# Patient Record
Sex: Female | Born: 1980 | Race: White | Hispanic: No | Marital: Married | Smoking: Never smoker
Health system: Southern US, Community
[De-identification: ages and names within clinical notes are randomized; demographics above are authoritative.]

## PROBLEM LIST (undated history)

## (undated) ENCOUNTER — Emergency Department (HOSPITAL_COMMUNITY): Admission: EM | Payer: 59 | Source: Home / Self Care

## (undated) DIAGNOSIS — J45909 Unspecified asthma, uncomplicated: Secondary | ICD-10-CM

---

## 2015-08-25 LAB — OB RESULTS CONSOLE HEPATITIS B SURFACE ANTIGEN: HEP B S AG: NEGATIVE

## 2015-08-25 LAB — OB RESULTS CONSOLE ANTIBODY SCREEN: Antibody Screen: NEGATIVE

## 2015-08-25 LAB — OB RESULTS CONSOLE RUBELLA ANTIBODY, IGM: RUBELLA: IMMUNE

## 2015-08-25 LAB — OB RESULTS CONSOLE ABO/RH: RH TYPE: POSITIVE

## 2015-08-25 LAB — OB RESULTS CONSOLE RPR: RPR: NONREACTIVE

## 2015-08-25 LAB — OB RESULTS CONSOLE GC/CHLAMYDIA
Chlamydia: NEGATIVE
Gonorrhea: NEGATIVE

## 2015-08-25 LAB — OB RESULTS CONSOLE HIV ANTIBODY (ROUTINE TESTING): HIV: NONREACTIVE

## 2016-01-25 NOTE — L&D Delivery Note (Signed)
Delivery Note At 8:14 AM a viable female was delivered via  (Presentation:straight OP ;  ).  APGAR: 9, 9; weight  .   Placenta status: , .  Cord:  with the following complications: .  Cord pH: sent  Anesthesia:  epid Episiotomy:  none Lacerations:  Sec deg Suture Repair: 3.0 vicryl rapide Est. Blood Loss (mL):  300  Mom to postpartum.  Baby to Couplet care / Skin to Skin. NICU in attendance   [name] 03/02/2016, 8:36 AM

## 2016-03-02 ENCOUNTER — Inpatient Hospital Stay (HOSPITAL_COMMUNITY): Payer: Managed Care, Other (non HMO) | Admitting: Anesthesiology

## 2016-03-02 ENCOUNTER — Encounter (HOSPITAL_COMMUNITY): Payer: Self-pay | Admitting: *Deleted

## 2016-03-02 ENCOUNTER — Inpatient Hospital Stay (HOSPITAL_COMMUNITY)
Admission: AD | Admit: 2016-03-02 | Discharge: 2016-03-04 | DRG: 775 | Disposition: A | Discharge status: Home / Self Care | Payer: Managed Care, Other (non HMO) | Source: Ambulatory Visit | Attending: Obstetrics and Gynecology | Admitting: Obstetrics and Gynecology

## 2016-03-02 DIAGNOSIS — O4703 False labor before 37 completed weeks of gestation, third trimester: Secondary | ICD-10-CM | POA: Diagnosis not present

## 2016-03-02 DIAGNOSIS — Z3A35 35 weeks gestation of pregnancy: Secondary | ICD-10-CM

## 2016-03-02 HISTORY — DX: Unspecified asthma, uncomplicated: J45.909

## 2016-03-02 LAB — CBC
HEMATOCRIT: 35.3 % — AB (ref 36.0–46.0)
HEMOGLOBIN: 11.6 g/dL — AB (ref 12.0–15.0)
MCH: 28 pg (ref 26.0–34.0)
MCHC: 32.9 g/dL (ref 30.0–36.0)
MCV: 85.3 fL (ref 78.0–100.0)
Platelets: 154 10*3/uL (ref 150–400)
RBC: 4.14 MIL/uL (ref 3.87–5.11)
RDW: 15.2 % (ref 11.5–15.5)
WBC: 9.8 10*3/uL (ref 4.0–10.5)

## 2016-03-02 LAB — TYPE AND SCREEN
ABO/RH(D): A POS
ANTIBODY SCREEN: NEGATIVE

## 2016-03-02 LAB — GROUP B STREP BY PCR: GROUP B STREP BY PCR: NEGATIVE

## 2016-03-02 LAB — RPR: RPR: NONREACTIVE

## 2016-03-02 LAB — ABO/RH: ABO/RH(D): A POS

## 2016-03-02 MED ORDER — BISACODYL 10 MG RE SUPP: 10.0000 mg | Freq: Every day | RECTAL | Status: DC | PRN

## 2016-03-02 MED ORDER — PHENYLEPHRINE 40 MCG/ML (10ML) SYRINGE FOR IV PUSH (FOR BLOOD PRESSURE SUPPORT)
80.0000 ug | PREFILLED_SYRINGE | INTRAVENOUS | Status: DC | PRN
  Filled 2016-03-02: qty 5

## 2016-03-02 MED ORDER — ACETAMINOPHEN 325 MG PO TABS: 650.0000 mg | ORAL_TABLET | ORAL | Status: DC | PRN

## 2016-03-02 MED ORDER — TETANUS-DIPHTH-ACELL PERTUSSIS 5-2.5-18.5 LF-MCG/0.5 IM SUSP
0.5000 mL | Freq: Once | INTRAMUSCULAR | Status: DC
Start: 2016-03-03 — End: 2016-03-04

## 2016-03-02 MED ORDER — OXYTOCIN 40 UNITS IN LACTATED RINGERS INFUSION - SIMPLE MED
2.5000 [IU]/h | INTRAVENOUS | Status: DC
  Filled 2016-03-02: qty 1000

## 2016-03-02 MED ORDER — OXYTOCIN BOLUS FROM INFUSION: 500.0000 mL | Freq: Once | INTRAVENOUS | Status: DC

## 2016-03-02 MED ORDER — EPHEDRINE 5 MG/ML INJ
10.0000 mg | INTRAVENOUS | Status: DC | PRN
  Filled 2016-03-02: qty 4

## 2016-03-02 MED ORDER — ONDANSETRON HCL 4 MG/2ML IJ SOLN: 4.0000 mg | INTRAMUSCULAR | Status: DC | PRN

## 2016-03-02 MED ORDER — BETAMETHASONE SOD PHOS & ACET 6 (3-3) MG/ML IJ SUSP
12.0000 mg | Freq: Once | INTRAMUSCULAR | Status: AC
  Administered 2016-03-02: 12 mg via INTRAMUSCULAR
  Filled 2016-03-02: qty 2

## 2016-03-02 MED ORDER — FENTANYL 2.5 MCG/ML BUPIVACAINE 1/10 % EPIDURAL INFUSION (WH - ANES): 14.0000 mL/h | INTRAMUSCULAR | Status: DC | PRN

## 2016-03-02 MED ORDER — SENNOSIDES-DOCUSATE SODIUM 8.6-50 MG PO TABS
2.0000 | ORAL_TABLET | ORAL | Status: DC
  Administered 2016-03-02 – 2016-03-03 (×2): 2 via ORAL
  Filled 2016-03-02 (×2): qty 2

## 2016-03-02 MED ORDER — SODIUM CHLORIDE 0.9 % IV SOLN
2.0000 g | Freq: Once | INTRAVENOUS | Status: AC
  Administered 2016-03-02: 2 g via INTRAVENOUS
  Filled 2016-03-02: qty 2000

## 2016-03-02 MED ORDER — FLEET ENEMA 7-19 GM/118ML RE ENEM: 1.0000 | ENEMA | Freq: Every day | RECTAL | Status: DC | PRN

## 2016-03-02 MED ORDER — MEASLES, MUMPS & RUBELLA VAC ~~LOC~~ INJ
0.5000 mL | INJECTION | Freq: Once | SUBCUTANEOUS | Status: DC
  Filled 2016-03-02: qty 0.5

## 2016-03-02 MED ORDER — WITCH HAZEL-GLYCERIN EX PADS
1.0000 "application " | MEDICATED_PAD | CUTANEOUS | Status: DC | PRN
  Administered 2016-03-02: 1 via TOPICAL

## 2016-03-02 MED ORDER — FLEET ENEMA 7-19 GM/118ML RE ENEM: 1.0000 | ENEMA | RECTAL | Status: DC | PRN

## 2016-03-02 MED ORDER — OXYCODONE-ACETAMINOPHEN 5-325 MG PO TABS: 2.0000 | ORAL_TABLET | ORAL | Status: DC | PRN

## 2016-03-02 MED ORDER — DIBUCAINE 1 % RE OINT: 1.0000 "application " | TOPICAL_OINTMENT | RECTAL | Status: DC | PRN

## 2016-03-02 MED ORDER — SIMETHICONE 80 MG PO CHEW: 80.0000 mg | CHEWABLE_TABLET | ORAL | Status: DC | PRN

## 2016-03-02 MED ORDER — LACTATED RINGERS IV SOLN: INTRAVENOUS | Status: DC

## 2016-03-02 MED ORDER — OXYCODONE-ACETAMINOPHEN 5-325 MG PO TABS
2.0000 | ORAL_TABLET | ORAL | Status: DC | PRN
Start: 2016-03-02 — End: 2016-03-04

## 2016-03-02 MED ORDER — OXYCODONE-ACETAMINOPHEN 5-325 MG PO TABS: 1.0000 | ORAL_TABLET | ORAL | Status: DC | PRN

## 2016-03-02 MED ORDER — BENZOCAINE-MENTHOL 20-0.5 % EX AERO
1.0000 "application " | INHALATION_SPRAY | CUTANEOUS | Status: DC | PRN
  Filled 2016-03-02: qty 56

## 2016-03-02 MED ORDER — ONDANSETRON HCL 4 MG PO TABS
4.0000 mg | ORAL_TABLET | ORAL | Status: DC | PRN
Start: 2016-03-02 — End: 2016-03-04

## 2016-03-02 MED ORDER — ONDANSETRON HCL 4 MG/2ML IJ SOLN: 4.0000 mg | Freq: Four times a day (QID) | INTRAMUSCULAR | Status: DC | PRN

## 2016-03-02 MED ORDER — LIDOCAINE HCL (PF) 1 % IJ SOLN
INTRAMUSCULAR | Status: DC | PRN
  Administered 2016-03-02 (×2): 4 mL via EPIDURAL

## 2016-03-02 MED ORDER — DIPHENHYDRAMINE HCL 50 MG/ML IJ SOLN: 12.5000 mg | INTRAMUSCULAR | Status: DC | PRN

## 2016-03-02 MED ORDER — LACTATED RINGERS IV SOLN: 500.0000 mL | INTRAVENOUS | Status: DC | PRN

## 2016-03-02 MED ORDER — COCONUT OIL OIL: 1.0000 "application " | TOPICAL_OIL | Status: DC | PRN

## 2016-03-02 MED ORDER — IBUPROFEN 800 MG PO TABS: 800.0000 mg | ORAL_TABLET | Freq: Three times a day (TID) | ORAL | Status: DC | PRN

## 2016-03-02 MED ORDER — PRENATAL MULTIVITAMIN CH
1.0000 | ORAL_TABLET | Freq: Every day | ORAL | Status: DC
  Administered 2016-03-02 – 2016-03-03 (×2): 1 via ORAL
  Filled 2016-03-02 (×2): qty 1

## 2016-03-02 MED ORDER — LIDOCAINE HCL (PF) 1 % IJ SOLN
30.0000 mL | INTRAMUSCULAR | Status: DC | PRN
  Filled 2016-03-02: qty 30

## 2016-03-02 MED ORDER — OXYCODONE-ACETAMINOPHEN 5-325 MG PO TABS
1.0000 | ORAL_TABLET | ORAL | Status: DC | PRN
Start: 2016-03-02 — End: 2016-03-04

## 2016-03-02 MED ORDER — FENTANYL 2.5 MCG/ML BUPIVACAINE 1/10 % EPIDURAL INFUSION (WH - ANES)
14.0000 mL/h | INTRAMUSCULAR | Status: DC | PRN
  Administered 2016-03-02: 14 mL/h via EPIDURAL

## 2016-03-02 MED ORDER — DIPHENHYDRAMINE HCL 25 MG PO CAPS: 25.0000 mg | ORAL_CAPSULE | Freq: Four times a day (QID) | ORAL | Status: DC | PRN

## 2016-03-02 MED ORDER — OXYTOCIN 40 UNITS IN LACTATED RINGERS INFUSION - SIMPLE MED
1.0000 m[IU]/min | INTRAVENOUS | Status: DC
  Administered 2016-03-02: 1 m[IU]/min via INTRAVENOUS

## 2016-03-02 MED ORDER — TERBUTALINE SULFATE 1 MG/ML IJ SOLN
0.2500 mg | Freq: Once | INTRAMUSCULAR | Status: DC | PRN
  Filled 2016-03-02: qty 1

## 2016-03-02 MED ORDER — SOD CITRATE-CITRIC ACID 500-334 MG/5ML PO SOLN: 30.0000 mL | ORAL | Status: DC | PRN

## 2016-03-02 MED ORDER — FENTANYL 2.5 MCG/ML BUPIVACAINE 1/10 % EPIDURAL INFUSION (WH - ANES)
INTRAMUSCULAR | Status: AC
  Filled 2016-03-02: qty 100

## 2016-03-02 MED ORDER — LACTATED RINGERS IV SOLN: 500.0000 mL | Freq: Once | INTRAVENOUS | Status: DC

## 2016-03-02 MED ORDER — ZOLPIDEM TARTRATE 5 MG PO TABS: 5.0000 mg | ORAL_TABLET | Freq: Every evening | ORAL | Status: DC | PRN

## 2016-03-02 MED ORDER — ACETAMINOPHEN 325 MG PO TABS
650.0000 mg | ORAL_TABLET | ORAL | Status: DC | PRN
  Administered 2016-03-02 – 2016-03-03 (×5): 650 mg via ORAL
  Filled 2016-03-02 (×5): qty 2

## 2016-03-02 NOTE — Anesthesia Preprocedure Evaluation (Signed)
Anesthesia Evaluation  Patient identified by MRN, date of birth, ID band Patient awake    Reviewed: Allergy & Precautions, Patient's Chart, lab work & pertinent test results  Airway Mallampati: III       Dental no notable dental hx. (+) Teeth Intact   Pulmonary asthma ,    Pulmonary exam normal breath sounds clear to auscultation       Cardiovascular negative cardio ROS Normal cardiovascular exam Rhythm:Regular Rate:Normal     Neuro/Psych negative neurological ROS  negative psych ROS   GI/Hepatic negative GI ROS, Neg liver ROS,   Endo/Other  negative endocrine ROS  Renal/GU negative Renal ROS  negative genitourinary   Musculoskeletal negative musculoskeletal ROS (+)   Abdominal   Peds  Hematology negative hematology ROS (+)   Anesthesia Other Findings   Reproductive/Obstetrics (+) Pregnancy                             Anesthesia Physical Anesthesia Plan  ASA: II  Anesthesia Plan: Epidural   Post-op Pain Management:    Induction:   Airway Management Planned: Natural Airway  Additional Equipment:   Intra-op Plan:   Post-operative Plan:   Informed Consent: I have reviewed the patients History and Physical, chart, labs and discussed the procedure including the risks, benefits and alternatives for the proposed anesthesia with the patient or authorized representative who has indicated his/her understanding and acceptance.     Plan Discussed with: Anesthesiologist  Anesthesia Plan Comments:         Anesthesia Quick Evaluation

## 2016-03-02 NOTE — Lactation Note (Signed)
This note was copied from a baby's chart. Lactation Consultation Note  Patient Name: Linda [name]: 03/02/2016 Reason for consult: Follow-up assessment  Baby is 7 hours old, 2nd LC visit today. Per mom the baby has been breast feeding well .  @ the time of this consult baby , per mom baby due feed. LC checked diaper, and changed wet Diaper.  Mom independent with latch. LC offered pillow support and mom declined. Mom leaning towards the baby. LC asked mom if she needed pillow support for her back and mom declined.  Mom obtained depth on her own,baby's lips flanged, multiple swallows noted and baby fed 17 mins. Per mom comfortable for the whole feeding , and baby content after feeding.  LC set up the DEBP due to the baby  being a LPT, and mom was not ready to pump.  Per mom familiar with early baby's and pumping due her last baby being in NICU for a week.  LC also encouraged mom to hand express, save the milk so the baby can be spoon fed.  Mother informed of post-discharge support and given phone number to the lactation department, including services for phone call assistance; out-patient appointments; and breastfeeding support group. List of other breastfeeding resources in the community given in the handout. Encouraged mother to call for problems or concerns related to breastfeeding.   Maternal Data Has patient been taught Hand Expression?: Yes (per mom feels comfortable )  Feeding Feeding Type: Breast Fed Length of feed: 20 min  LATCH Score/Interventions Latch: Grasps breast easily, tongue down, lips flanged, rhythmical sucking.  Audible Swallowing: Spontaneous and intermittent  Type of Nipple: Everted at rest and after stimulation  Comfort (Breast/Nipple): Soft / non-tender     Hold (Positioning): No assistance needed to correctly position infant at breast.  LATCH Score: 10  Lactation Tools Discussed/Used     Consult Status Consult Status: Follow-up [name]:  03/02/16 Follow-up type: In-patient    [name] 03/02/2016, 2:54 PM

## 2016-03-02 NOTE — Anesthesia Postprocedure Evaluation (Signed)
Anesthesia Post Note  Patient: Linda KaufmannNaudia [name]  Procedure(s) Performed: * No procedures listed *  Patient location during evaluation: Mother Baby Anesthesia Type: Epidural Level of consciousness: awake, awake and alert and oriented Pain management: pain level controlled Vital Signs Assessment: post-procedure vital signs reviewed and stable Respiratory status: spontaneous breathing and nonlabored ventilation Cardiovascular status: stable Postop Assessment: no headache, no backache, epidural receding, patient able to bend at knees, no signs of nausea or vomiting and adequate PO intake Anesthetic complications: no        Last Vitals:  Vitals:   03/02/16 1048 03/02/16 1139  BP: (!) 98/56 (!) 99/56  Pulse: 76 71  Resp: 18 16  Temp: 36.8 C 36.6 C    Last Pain:  Vitals:   03/02/16 1139  TempSrc: Oral  PainSc:    Pain Goal:                 [name],[name]

## 2016-03-02 NOTE — MAU Provider Note (Signed)
Chief Complaint:  Contractions   First Provider Initiated Contact with Patient 03/02/2016 at 0425.   HPI: Linda [name] is a 36 y.o. [id number] at 5228w6d who presents to maternity admissions reporting contractions every 5 minutes since 0200. Denies LOF or VB. Hx PTD x 3. Has not received BMZ this pregnancy.   Good fetal movement.   Pregnancy Course: 17-P this pregnancy  Past Medical History:  Diagnosis Date  . Asthma    OB History  Gravida Para Term Preterm AB Living  5 4 1 3   4   SAB TAB Ectopic Multiple Live Births          4    # Outcome Date GA Lbr Len/2nd Weight Sex Delivery Anes PTL Lv  5 Current           4 Preterm      Vag-Spont     3 Term      Vag-Spont     2 Preterm      Vag-Spont     1 Preterm      Vag-Spont        History reviewed. No pertinent surgical history. History reviewed. No pertinent family history. Social History  Substance Use Topics  . Smoking status: Never Smoker  . Smokeless tobacco: Never Used  . Alcohol use No   No Known Allergies Prescriptions Prior to Admission  Medication Sig Dispense Refill Last Dose  . acetaminophen (TYLENOL) 325 MG tablet Take 650 mg by mouth every 6 (six) hours as needed.   03/01/2016 at Unknown time  . Prenatal Vit-Fe Fumarate-FA (PRENATAL MULTIVITAMIN) TABS tablet Take 1 tablet by mouth daily at 12 noon.   03/01/2016 at Unknown time    I have reviewed patient's Past Medical Hx, Surgical Hx, Family Hx, Social Hx, medications and allergies.   ROS:  Review of Systems  Constitutional: Negative for chills and fever.  Gastrointestinal: Positive for abdominal pain (contractions only ).  Genitourinary: Negative for vaginal bleeding.  Neurological: Negative for headaches.    Physical Exam  Patient Vitals for the past 24 hrs:  BP Temp Temp src Pulse Resp  03/02/16 0434 115/65 98.2 F (36.8 C) Oral 95 18  03/02/16 0419 117/73 97.9 F (36.6 C) Oral 102 20   Constitutional: Well-developed, well-nourished female in moderate  distress w/ UC's distress.  Cardiovascular: normal rate Respiratory: normal effort GI: Abd soft, non-tender, gravid appropriate for gestational age. MS: Extremities nontender, no edema, normal ROM Neurologic: Alert and oriented x 4.  GU: Pelvic: NEFG, normal bloody show  Dilation: 8 Effacement (%): 90 Station: 0 Exam by:: Ivonne AndrewV. Iza Preston CNM  FHT:  Baseline 155 , moderate variability, accelerations present, no decelerations Contractions: q 5 mins, strong   Labs: Unknown GBS  Assessment: 1. Labor: Active preterm labor 2. Fetal Wellbeing: Category I  3. Pain Control: None 4. GBS: Unknown 5. 35.6 week IUP  Plan:  1. Admit to BS per consult with MD 2. Routine L&D orders 3. Analgesia/anesthesia PRN  4. Ampicillin for Unknown GBS preterm 5. BMZ   [place] Azoria Abbett, [place] 03/02/2016 4:36 AM

## 2016-03-02 NOTE — MAU Note (Signed)
Pt presents complaining of contractions every 5 minutes since 2am. Denies bleeding or leaking. Reports good fetal movement.

## 2016-03-02 NOTE — H&P (Signed)
  Chief Complaint:  Contractions   First Provider Initiated Contact with Patient 03/02/2016 at 0425.   HPI: Linda [name] is a 36 y.o. [id number] at 5551w6d who presents to maternity admissions reporting contractions every 5 minutes since 0200. Denies LOF or VB. Hx PTD x 3. Has not received BMZ this pregnancy.   Good fetal movement.   Pregnancy Course: 17-P this pregnancy      Past Medical History:  Diagnosis Date  . Asthma                    OB History  Gravida Para Term Preterm AB Living  5 4 1 3   4   SAB TAB Ectopic Multiple Live Births          4    # Outcome Date GA Lbr Len/2nd Weight Sex Delivery Anes PTL Lv  5 Current           4 Preterm      Vag-Spont     3 Term      Vag-Spont     2 Preterm      Vag-Spont     1 Preterm      Vag-Spont        History reviewed. No pertinent surgical history. History reviewed. No pertinent family history. Social History  Substance Use Topics  . Smoking status: Never Smoker  . Smokeless tobacco: Never Used  . Alcohol use No   No Known Allergies        Prescriptions Prior to Admission  Medication Sig Dispense Refill Last Dose  . acetaminophen (TYLENOL) 325 MG tablet Take 650 mg by mouth every 6 (six) hours as needed.   03/01/2016 at Unknown time  . Prenatal Vit-Fe Fumarate-FA (PRENATAL MULTIVITAMIN) TABS tablet Take 1 tablet by mouth daily at 12 noon.   03/01/2016 at Unknown time    I have reviewed patient's Past Medical Hx, Surgical Hx, Family Hx, Social Hx, medications and allergies.   ROS:  Review of Systems  Constitutional: Negative for chills and fever.  Gastrointestinal: Positive for abdominal pain (contractions only ).  Genitourinary: Negative for vaginal bleeding.  Neurological: Negative for headaches.    Physical Exam  Patient Vitals for the past 24 hrs:  BP Temp Temp src Pulse Resp  03/02/16 0434 115/65 98.2 F (36.8 C) Oral 95 18  03/02/16 0419 117/73 97.9 F  (36.6 C) Oral 102 20   Constitutional: Well-developed, well-nourished female in moderate distress w/ UC's distress.  Cardiovascular: normal rate Respiratory: normal effort GI: Abd soft, non-tender, gravid appropriate for gestational age. MS: Extremities nontender, no edema, normal ROM Neurologic: Alert and oriented x 4.  GU: Pelvic: NEFG, normal bloody show             Dilation: 8 Effacement (%): 90 Station: 0 Exam by:: Ivonne AndrewV. Smith CNM  FHT:  Baseline 155 , moderate variability, accelerations present, no decelerations Contractions: q 5 mins, strong   Labs: Unknown GBS  Assessment: 1. Labor: Active preterm labor 2. Fetal Wellbeing: Category I  3. Pain Control: None 4. GBS: Unknown 5. 35.6 week IUP  Plan:  1. Admit to BS per consult with MD 2. Routine L&D orders 3. Analgesia/anesthesia PRN  4. Ampicillin for Unknown GBS preterm 5. BMZ   [place], [place] 03/02/2016 4:36 AM  Agree with above Patient comfortable in LDR FHT cat one IV in, labs drawn, BMZ given Patient awaiting epidural

## 2016-03-02 NOTE — Lactation Note (Signed)
This note was copied from a baby's chart. Lactation Consultation Note  Patient Name: Linda [name] [id number] Date: 03/02/2016 Reason for consult: Initial assessment;Late preterm infant   Initial brief consult with mom as she was leaving [employer]. Mom reports she BF her 4 older children with the longest being 18 months.   LPT infant Handout given and explained to mom . Enc mom to limit stim, keep hat on at all times and to feed infant at least every 3 hours. Discussed that I would recommend her begin pumping today and offer infant any EBM that is obtained. Mom voiced understanding. Pump kit placed in PPU room and Joycelyn Das, RN was informed pump needs to be set up.   Mom reports she is aware of how to hand express. Enc mom to hand express colostrum for infant. Mom will need follow up later today. [employer] and BF Resources Handout given, mom informed of IP/OP Services, BF Support Groups and [facility] phone #. Enc mom to call out for feeding assistance as needed.    Maternal Data Formula Feeding for Exclusion: No Does the patient have breastfeeding experience prior to this delivery?: Yes  Feeding Feeding Type: Breast Fed Length of feed: 60 min  LATCH Score/Interventions Latch: Grasps breast easily, tongue down, lips flanged, rhythmical sucking.  Audible Swallowing: A few with stimulation  Type of Nipple: Everted at rest and after stimulation  Comfort (Breast/Nipple): Soft / non-tender     Hold (Positioning): No assistance needed to correctly position infant at breast.  LATCH Score: 9  Lactation Tools Discussed/Used WIC Program: No   Consult Status Consult Status: Follow-up Date: 03/02/16 Follow-up type: In-patient    Debby Freiberg Hice 03/02/2016, 10:22 AM

## 2016-03-02 NOTE — Anesthesia Procedure Notes (Signed)
Epidural Patient location during procedure: OB Start time: 03/02/2016 5:08 AM  Staffing Anesthesiologist: Mal AmabileFOSTER, Lucky Alverson Performed: anesthesiologist   Preanesthetic Checklist Completed: patient identified, site marked, surgical consent, pre-op evaluation, timeout performed, IV checked, risks and benefits discussed and monitors and equipment checked  Epidural Patient position: sitting Prep: site prepped and draped and DuraPrep Patient monitoring: continuous pulse ox and blood pressure Approach: midline Location: L3-L4 Injection technique: LOR air  Needle:  Needle type: Tuohy  Needle gauge: 17 G Needle length: 9 cm and 9 Needle insertion depth: 4 cm Catheter type: closed end flexible Catheter size: 19 Gauge Catheter at skin depth: 9 cm Test dose: negative and Other  Assessment Events: blood not aspirated, injection not painful, no injection resistance, negative IV test and no paresthesia  Additional Notes Patient identified. Risks and benefits discussed including failed block, incomplete  Pain control, post dural puncture headache, nerve damage, paralysis, blood pressure Changes, nausea, vomiting, reactions to medications-both toxic and allergic and post Partum back pain. All questions were answered. Patient expressed understanding and wished to proceed. Sterile technique was used throughout procedure. Epidural site was Dressed with sterile barrier dressing. No paresthesias, signs of intravascular injection Or signs of intrathecal spread were encountered.  Patient was more comfortable after the epidural was dosed. Please see RN's note for documentation of vital signs and FHR which are stable.

## 2016-03-02 NOTE — Progress Notes (Signed)
Notified of pt arrival in MAU and advanced dilation. Will come to hospital for delivery and admit to labor and delivery unit.

## 2016-03-03 LAB — CBC
HCT: 30.2 % — ABNORMAL LOW (ref 36.0–46.0)
HEMOGLOBIN: 10 g/dL — AB (ref 12.0–15.0)
MCH: 28.3 pg (ref 26.0–34.0)
MCHC: 33.1 g/dL (ref 30.0–36.0)
MCV: 85.6 fL (ref 78.0–100.0)
PLATELETS: 173 10*3/uL (ref 150–400)
RBC: 3.53 MIL/uL — AB (ref 3.87–5.11)
RDW: 15.4 % (ref 11.5–15.5)
WBC: 12.4 10*3/uL — AB (ref 4.0–10.5)

## 2016-03-03 NOTE — Lactation Note (Addendum)
This note was copied from a baby's chart. Lactation Consultation Note  Patient Name: Linda [name] [id number]'s Date: 03/03/2016 Reason for consult: Follow-up assessment;Late preterm infant   Follow up with mom of LPT infant. Infant with 8 BF for 10-30 minutes, EBM x 2 of 3 cc via syringe, 8 voids and 3 stools in 24 hours preceding this assessment. LATCH scores 9. Infant weight 6 lb 4.5 oz with 3% weight loss since birth.   Mom reports her breast are feeling fuller today. She reports some milk nipple tenderness with initial latch.   Mom reports BF is going well. She reports infant has been sleepy this afternoon and had a stool and then awakened to feed. Mom has pumped and a bottle of 8-10 cc was sitting on bedside table, enc mom to feed to infant post BF, mom voiced understanding.   Mom reports she plans to have infant circumcised tomorrow, discussed that infant may be sleepy for 6-8 hours post BF. Enc mom to supplement infant with pumped EBM if too sleepy to latch.   Mom declined needing LC assistance at this time. Enc mom to call for assistance as needed.    Maternal Data Formula Feeding for Exclusion: No Has patient been taught Hand Expression?: Yes Does the patient have breastfeeding experience prior to this delivery?: Yes  Feeding    LATCH Score/Interventions                      Lactation Tools Discussed/Used Pump Review: Setup, frequency, and cleaning;Milk Storage   Consult Status Consult Status: Follow-up Date: 03/04/16 Follow-up type: In-patient    Silas FloodSharon S Devyne Hauger 03/03/2016, 6:25 PM

## 2016-03-03 NOTE — Progress Notes (Signed)
Post Partum Day 1 Subjective: no complaints, up ad lib, voiding, tolerating PO and + flatus  Objective: Blood pressure (!) 98/50, pulse 71, temperature 98 F (36.7 C), temperature source Oral, resp. rate 17, height 5\' 6"  (1.676 m), weight 165 lb (74.8 kg), SpO2 98 %, unknown if currently breastfeeding.  Physical Exam:  General: alert and cooperative Lochia: appropriate Uterine Fundus: firm Incision: healing well DVT Evaluation: No evidence of DVT seen on physical exam. Negative Homan's sign. No cords or calf tenderness. No significant calf/ankle edema.   Recent Labs  03/02/16 0442 03/03/16 0549  HGB 11.6* 10.0*  HCT 35.3* 30.2*    Assessment/Plan: Plan for discharge tomorrow and Breastfeeding   Pt desires circ tomorrow, not today.    LOS: 1 day   CURTIS,CAROL G 03/03/2016, 8:05 AM

## 2016-03-04 NOTE — Discharge Summary (Signed)
Obstetric Discharge Summary Reason for Admission: onset of labor Prenatal Procedures: ultrasound Intrapartum Procedures: spontaneous vaginal delivery Postpartum Procedures: none Complications-Operative and Postpartum: 2 degree perineal laceration Hemoglobin  Date Value Ref Range Status  03/03/2016 10.0 (L) 12.0 - 15.0 g/dL Final   HCT  Date Value Ref Range Status  03/03/2016 30.2 (L) 36.0 - 46.0 % Final    Physical Exam:  General: alert and cooperative Lochia: appropriate Uterine Fundus: firm Incision: healing well DVT Evaluation: No evidence of DVT seen on physical exam. Negative Homan's sign. No cords or calf tenderness. No significant calf/ankle edema.  Discharge Diagnoses: Term Pregnancy-delivered  Discharge Information: Date: 03/04/2016 Activity: pelvic rest Diet: routine Medications: PNV Condition: stable Instructions: refer to practice specific booklet Discharge to: home   Newborn Data: Live born female  Birth Weight: 6 lb 7.7 oz (2940 g) APGAR: 9, 9  Home with mother.  CURTIS,CAROL G 03/04/2016, 7:53 AM

## 2017-07-24 ENCOUNTER — Emergency Department: Admit: 2017-07-24 | Payer: PRIVATE HEALTH INSURANCE

## 2017-07-24 ENCOUNTER — Inpatient Hospital Stay
Admit: 2017-07-24 | Discharge: 2017-07-24 | Disposition: A | Discharge status: Home / Self Care | Payer: PRIVATE HEALTH INSURANCE | Attending: Emergency Medicine

## 2017-07-24 DIAGNOSIS — N1330 Unspecified hydronephrosis: Secondary | ICD-10-CM

## 2017-07-24 LAB — POC PREGNANCY UR-QUAL: HCG, Urine, POC: NEGATIVE

## 2017-07-24 LAB — COMPREHENSIVE METABOLIC PANEL W/ REFLEX TO MG FOR LOW K
ALT: 8 U/L (ref 0–32)
AST: 14 U/L (ref 0–31)
Albumin: 5.1 g/dL (ref 3.5–5.2)
Alkaline Phosphatase: 94 U/L (ref 35–104)
Anion Gap: 11 mmol/L (ref 7–16)
BUN: 8 mg/dL (ref 6–20)
CO2: 25 mmol/L (ref 22–29)
Calcium: 9.1 mg/dL (ref 8.6–10.2)
Chloride: 101 mmol/L (ref 98–107)
Creatinine: 0.7 mg/dL (ref 0.5–1.0)
GFR African American: >60
GFR Non-African American: >60 mL/min/{1.73_m2} (ref >=60)
Glucose: 187 mg/dL — ABNORMAL HIGH (ref 74–99)
Potassium reflex Magnesium: 4.2 mmol/L (ref 3.5–5.0)
Sodium: 137 mmol/L (ref 132–146)
Total Bilirubin: 1.2 mg/dL (ref 0.0–1.2)
Total Protein: 8 g/dL (ref 6.4–8.3)

## 2017-07-24 LAB — URINALYSIS
Bilirubin Urine: NEGATIVE
Glucose, Ur: NEGATIVE mg/dL
Nitrite, Urine: NEGATIVE
Protein, UA: NEGATIVE mg/dL
Specific Gravity, UA: 1.01 (ref 1.005–1.030)
Urobilinogen, Urine: 0.2 E.U./dL (ref <2.0)
pH, UA: 6 (ref 5.0–9.0)

## 2017-07-24 LAB — CBC WITH AUTO DIFFERENTIAL
Basophils %: 0.7 % (ref 0.0–2.0)
Basophils Absolute: 0.05 E9/L (ref 0.00–0.20)
Eosinophils %: 1 % (ref 0.0–6.0)
Eosinophils Absolute: 0.07 E9/L (ref 0.05–0.50)
Hematocrit: 37.7 % (ref 34.0–48.0)
Hemoglobin: 11.9 g/dL (ref 11.5–15.5)
Immature Granulocytes #: 0.01 E9/L
Immature Granulocytes %: 0.1 % (ref 0.0–5.0)
Lymphocytes %: 15.8 % — ABNORMAL LOW (ref 20.0–42.0)
Lymphocytes Absolute: 1.1 E9/L — ABNORMAL LOW (ref 1.50–4.00)
MCH: 27.5 pg (ref 26.0–35.0)
MCHC: 31.6 % — ABNORMAL LOW (ref 32.0–34.5)
MCV: 87.1 fL (ref 80.0–99.9)
MPV: 11.6 fL (ref 7.0–12.0)
Monocytes %: 10.9 % (ref 2.0–12.0)
Monocytes Absolute: 0.76 E9/L (ref 0.10–0.95)
Neutrophils %: 71.5 % (ref 43.0–80.0)
Neutrophils Absolute: 4.99 E9/L (ref 1.80–7.30)
Platelets: 206 E9/L (ref 130–450)
RBC: 4.33 E12/L (ref 3.50–5.50)
RDW: 13.4 fL (ref 11.5–15.0)
WBC: 7 E9/L (ref 4.5–11.5)

## 2017-07-24 LAB — LACTIC ACID: Lactic Acid: 1.1 mmol/L (ref 0.5–2.2)

## 2017-07-24 LAB — MICROSCOPIC URINALYSIS

## 2017-07-24 LAB — SEDIMENTATION RATE: Sed Rate, Automated: 25 mm/h — ABNORMAL HIGH (ref 0–20)

## 2017-07-24 LAB — LIPASE: Lipase: 24 U/L (ref 13–60)

## 2017-07-24 MED ORDER — HYDROCODONE-ACETAMINOPHEN 5-325 MG PO TABS
5-325 MG | ORAL_TABLET | Freq: Four times a day (QID) | ORAL | 0 refills | Status: AC | PRN
Start: 2017-07-24 — End: 2017-07-27

## 2017-07-24 MED ORDER — IOPAMIDOL 76 % IV SOLN
76 % | Freq: Once | INTRAVENOUS | Status: AC | PRN
Start: 2017-07-24 — End: 2017-07-24
  Administered 2017-07-24: 20:00:00 80 mL via INTRAVENOUS

## 2017-07-24 MED ORDER — ONDANSETRON 4 MG PO TBDP
4 MG | ORAL_TABLET | Freq: Three times a day (TID) | ORAL | 0 refills | Status: AC | PRN
Start: 2017-07-24 — End: ?

## 2017-07-24 NOTE — ED Notes (Signed)
The patient is back from CT, she voided prior to going. The patient explained that she did have a lot of urine that voided.     Erika EisenmengerFelicia Marie Orlyn Odonoghue, RN  07/24/17 [id number]

## 2017-07-24 NOTE — ED Notes (Signed)
Pt finished oral contrast. CT aware     Leanord AsalJohn Cameron Schwinn, RN  07/24/17 1501

## 2017-07-24 NOTE — ED Notes (Signed)
The patient complains of nausea but no vomiting, no urinary complaints, no diarrhea or constipation or fevers.     Leanord AsalJohn Elai Vanwyk, RN  07/24/17 1501

## 2017-07-24 NOTE — ED Provider Notes (Signed)
HPI:  07/24/17, Time: 2:19 PM         Erika James is a 37 y.o. female presenting to the ED for abdominal pain, nausea, and vomiting, beginning two days ago.  The complaint has been persistent, moderate in severity, and worsened by changing position.  Patient presents with right lower quadrant abdominal pain which seemed to begin about 2 days ago and has been intermittent in nature since then.  She does state that it is worse with changing of position and seems to be mild and tolerable when she is at rest.  She also mentions some associated nausea and nonbloody, nonbilious vomiting.  She denies any changes in bowel habits.  She denies any changes in urination or vaginal bleeding or discharge.  She denies fevers or chills. She denies previous abdominal surgeries.    Review of Systems:   Pertinent positives and negatives are stated within HPI, all other systems reviewed and are negative.          --------------------------------------------- PAST HISTORY ---------------------------------------------  Past Medical History:  has a past medical history of Asthma.    Past Surgical History:  has no past surgical history on file.    Social History:  reports that she has never smoked. She does not have any smokeless tobacco history on file. She reports that she drank alcohol. She reports that she does not use drugs.    Family History: family history is not on file.     The patient???s home medications have been reviewed.    Allergies: Ibuprofen    -------------------------------------------------- RESULTS -------------------------------------------------  All laboratory and radiology results have been personally reviewed by myself   LABS:  Results for orders placed or performed during the hospital encounter of 07/24/17   CBC Auto Differential   Result Value Ref Range    WBC 7.0 4.5 - 11.5 E9/L    RBC 4.33 3.50 - 5.50 E12/L    Hemoglobin 11.9 11.5 - 15.5 g/dL    Hematocrit 45.4 09.8 - 48.0 %    MCV 87.1 80.0 - 99.9 fL    MCH 27.5  26.0 - 35.0 pg    MCHC 31.6 (L) 32.0 - 34.5 %    RDW 13.4 11.5 - 15.0 fL    Platelets 206 130 - 450 E9/L    MPV 11.6 7.0 - 12.0 fL    Neutrophils % 71.5 43.0 - 80.0 %    Immature Granulocytes % 0.1 0.0 - 5.0 %    Lymphocytes % 15.8 (L) 20.0 - 42.0 %    Monocytes % 10.9 2.0 - 12.0 %    Eosinophils % 1.0 0.0 - 6.0 %    Basophils % 0.7 0.0 - 2.0 %    Neutrophils # 4.99 1.80 - 7.30 E9/L    Immature Granulocytes # 0.01 E9/L    Lymphocytes # 1.10 (L) 1.50 - 4.00 E9/L    Monocytes # 0.76 0.10 - 0.95 E9/L    Eosinophils # 0.07 0.05 - 0.50 E9/L    Basophils # 0.05 0.00 - 0.20 E9/L   Comprehensive Metabolic Panel w/ Reflex to MG   Result Value Ref Range    Sodium 137 132 - 146 mmol/L    Potassium reflex Magnesium 4.2 3.5 - 5.0 mmol/L    Chloride 101 98 - 107 mmol/L    CO2 25 22 - 29 mmol/L    Anion Gap 11 7 - 16 mmol/L    Glucose 187 (H) 74 - 99 mg/dL    BUN  8 6 - 20 mg/dL    CREATININE 0.7 0.5 - 1.0 mg/dL    GFR Non-African American >60 >=60 mL/min/1.73    GFR African American >60     Calcium 9.1 8.6 - 10.2 mg/dL    Total Protein 8.0 6.4 - 8.3 g/dL    Alb 5.1 3.5 - 5.2 g/dL    Total Bilirubin 1.2 0.0 - 1.2 mg/dL    Alkaline Phosphatase 94 35 - 104 U/L    ALT 8 0 - 32 U/L    AST 14 0 - 31 U/L   Lipase   Result Value Ref Range    Lipase 24 13 - 60 U/L   Sedimentation Rate   Result Value Ref Range    Sed Rate 25 (H) 0 - 20 mm/Hr   Urinalysis, reflex to microscopic   Result Value Ref Range    Color, UA Straw Straw/Yellow    Clarity, UA Clear Clear    Glucose, Ur Negative Negative mg/dL    Bilirubin Urine Negative Negative    Ketones, Urine TRACE (A) Negative mg/dL    Specific Gravity, UA 1.010 1.005 - 1.030    Blood, Urine TRACE (A) Negative    pH, UA 6.0 5.0 - 9.0    Protein, UA Negative Negative mg/dL    Urobilinogen, Urine 0.2 <2.0 E.U./dL    Nitrite, Urine Negative Negative    Leukocyte Esterase, Urine TRACE (A) Negative   Lactic Acid, Plasma   Result Value Ref Range    Lactic Acid 1.1 0.5 - 2.2 mmol/L   Microscopic  Urinalysis   Result Value Ref Range    WBC, UA 2-5 0 - 5 /HPF    RBC, UA 0-1 0 - 2 /HPF    Epi Cells RARE /HPF    Bacteria, UA RARE (A) /HPF   POC Pregnancy Urine   Result Value Ref Range    HCG, Urine, POC Negative Negative    Lot Number [id number]     Positive QC Pass/Fail Pass     Negative QC Pass/Fail Pass        RADIOLOGY:  Interpreted by Radiologist.  CT ABDOMEN PELVIS W IV CONTRAST Additional Contrast? Oral   Final Result   1. Significant right hydroureter and moderate right hydronephrosis.   The urinary bladder is distended. The etiology for the hydroureter is   not identified on this examination. It is recommended that the patient   voids or have a Foley catheter placed with repeat CT imaging for   further evaluation. No obstructing calculus is appreciated.         2. There is no evidence of appendicitis...      CT ABDOMEN PELVIS WO CONTRAST    (Results Pending)       ------------------------- NURSING NOTES AND VITALS REVIEWED ---------------------------   The nursing notes within the ED encounter and vital signs as below have been reviewed.   BP 107/73    Pulse 86    Temp 98.9 ??F (37.2 ??C) (Oral)    Resp 20    SpO2 99%   Oxygen Saturation Interpretation: Normal      ---------------------------------------------------PHYSICAL EXAM--------------------------------------      Constitutional/General: Alert and oriented x3, well appearing, non toxic in NAD  Head: Normocephalic and atraumatic  Eyes: PERRL, EOMI  Mouth: Oropharynx clear, handling secretions, no trismus  Neck: Supple, full ROM,   Pulmonary: Lungs clear to auscultation bilaterally, no wheezes, rales, or rhonchi. Not in respiratory distress  Cardiovascular:  Regular rate and  rhythm, no murmurs, gallops, or rubs. 2+ distal pulses  Abdomen: Soft, non distended. Positive tenderness in the RLQ with voluntary guarding but no rebound or rigidity. Positive bowel sounds in all four quadrants.  Extremities: Moves all extremities x 4. Warm and well  perfused  Skin: warm and dry without rash  Neurologic: GCS 15,  Psych: Normal Affect      ------------------------------ ED COURSE/MEDICAL DECISION MAKING----------------------  Medications   iopamidol (ISOVUE-370) 76 % injection 80 mL (80 mLs Intravenous Given 07/24/17 1552)         ED COURSE:  ED Course as of Jul 26 654   Mon Jul 24, 2017   1454   ATTENDING PROVIDER ATTESTATION:     I have personally performed and/or participated in the history, exam, medical decision making, and procedures and agree with all pertinent clinical information unless otherwise noted.    I have also reviewed and agree with the past medical, family and social history unless otherwise noted.    I have discussed this patient in detail with the resident and provided the instruction and education regarding the evidence-based evaluation and treatment of @CHIEFCOMPLAINTN @  History: patient presents with complaint of abdominal pain in the R flank and RLQ.  She states the symptoms began yesterday and have associated nausea and anorexia. She denies fevers or urinary symptoms.  No vaginal discharge.    My findings: Daniela Siebers is a 37 y.o. female whom is in no distress. Physical exam reveals heart rate is mildly tachycardic, lungs CTA, abdomen is soft and tender in the R flank and RLQ.  Guarding (+).    My plan: Symptomatic and supportive care. Will evaluate with CT.    Electronically signed by Susy Manor, DO on 07/24/17 at 2:54 PM          [JS]   1720 Spoke with the radiologist who states that the patient should have a repeat CT scan after she voids to determine if there is any discernible etiology for her right hydronephrosis and hydroureter.  Patient is agreeable with this plan.  She states that she will urinate and go back for repeat CT scan.  She denies any previous history of kidney problems or kidney stones.    [BM]      ED Course User Index  [BM] Lovie Chol, DO  [JS] Susy Manor, DO       Medical Decision  Making:    Patient presents with RLQ pain and nausea and vomiting raising concern for acute appendicitis. She is afebrile and in no distress. She will have baseline blood work drawn as well as urinalysis and CT scan of the abdomen and pelvis.     Counseling:   The emergency provider has spoken with the patient and discussed today???s results, in addition to providing specific details for the plan of care and counseling regarding the diagnosis and prognosis.  Questions are answered at this time and they are agreeable with the plan.      --------------------------------- IMPRESSION AND DISPOSITION ---------------------------------    IMPRESSION  1. Right flank pain    2. Hydronephrosis of right kidney        DISPOSITION  Disposition: Discharge to home  Patient condition is stable       [facility], DO  07/25/17 [phone]

## 2017-07-26 LAB — CULTURE, URINE: Urine Culture, Routine: >100000

## 2017-07-30 LAB — CULTURE, BLOOD 2: Culture, Blood 2: 5

## 2017-07-30 LAB — CULTURE BLOOD #1: Blood Culture, Routine: 5

## 2020-04-11 ENCOUNTER — Other Ambulatory Visit: Payer: Self-pay

## 2020-04-11 ENCOUNTER — Ambulatory Visit (INDEPENDENT_AMBULATORY_CARE_PROVIDER_SITE_OTHER): Payer: No Typology Code available for payment source

## 2020-04-11 ENCOUNTER — Ambulatory Visit
Admission: EM | Admit: 2020-04-11 | Discharge: 2020-04-11 | Disposition: A | Discharge status: Home / Self Care | Payer: No Typology Code available for payment source | Attending: Emergency Medicine | Admitting: Emergency Medicine

## 2020-04-11 ENCOUNTER — Encounter: Payer: Self-pay | Admitting: Emergency Medicine

## 2020-04-11 DIAGNOSIS — M79671 Pain in right foot: Secondary | ICD-10-CM | POA: Diagnosis not present

## 2020-04-11 MED ORDER — PREDNISONE 20 MG PO TABS
20.0000 mg | ORAL_TABLET | Freq: Two times a day (BID) | ORAL | 0 refills | Status: AC
Start: 2020-04-11 — End: 2020-04-16

## 2020-04-11 NOTE — ED Provider Notes (Signed)
[facility]   [id number] 04/11/20 Arrival Time: 1130  CC: RT heel pain  SUBJECTIVE: History from: patient. Isidra Mings is a 40 y.o. female complains of rt heel pain x 6 days.  Denies a precipitating event or specific injury.  Localizes the pain to the heel and inside of foot.  Describes the pain as constant and dull in character.  Has NOT tried OTC medications.  Symptoms are made worse with weight-bearing.  Denies similar symptoms in the past.  Denies fever, chills, erythema, ecchymosis, effusion, weakness.  ROS: As per HPI.  All other pertinent ROS negative.     Past Medical History:  Diagnosis Date  . Asthma    History reviewed. No pertinent surgical history. Allergies  Allergen Reactions  . Aspirin   . Tylenol [Acetaminophen]    No current facility-administered medications on file prior to encounter.   Current Outpatient Medications on File Prior to Encounter  Medication Sig Dispense Refill  . albuterol (PROVENTIL HFA;VENTOLIN HFA) 108 (90 Base) MCG/ACT inhaler Inhale 2 puffs into the lungs every 6 (six) hours as needed for wheezing or shortness of breath.    . Cholecalciferol (VITAMIN D-3) 5000 units TABS Take 1 tablet by mouth daily.    . Prenatal Vit-Fe Fumarate-FA (PRENATAL MULTIVITAMIN) TABS tablet Take 1 tablet by mouth daily at 12 noon.     Social History   Socioeconomic History  . Marital status: Married    Spouse name: Not on file  . Number of children: Not on file  . Years of education: Not on file  . Highest education level: Not on file  Occupational History  . Not on file  Tobacco Use  . Smoking status: Never Smoker  . Smokeless tobacco: Never Used  Substance and Sexual Activity  . Alcohol use: No  . Drug use: No  . Sexual activity: Not on file  Other Topics Concern  . Not on file  Social History Narrative  . Not on file   Social Determinants of Health   Financial Resource Strain: Not on file  Food Insecurity: Not on file   Transportation Needs: Not on file  Physical Activity: Not on file  Stress: Not on file  Social Connections: Not on file  Intimate Partner Violence: Not on file   History reviewed. No pertinent family history.  OBJECTIVE:  Vitals:   04/11/20 1137  BP: 120/77  Pulse: 80  Resp: 18  Temp: 98.3 F (36.8 C)  TempSrc: Oral  SpO2: 98%    General appearance: ALERT; in no acute distress.  Head: NCAT Lungs: Normal respiratory effort CV: Dorsalis pedis pulse 2+ Musculoskeletal: RT foot Inspection: Skin warm, dry, clear and intact without obvious erythema, effusion, or ecchymosis.  Palpation: TTP over heel and medial ankle/ foot ROM: FROM active and passive Skin: warm and dry Neurologic: Ambulates with minimal difficulty; Sensation intact about the lower extremities Psychological: alert and cooperative; normal mood and affect  DIAGNOSTIC STUDIES:  DG Foot Complete Right  Result Date: 04/11/2020 CLINICAL DATA:  Rt foot pain since Sunday greatest at the heel. EXAM: RIGHT FOOT COMPLETE - 3+ VIEW COMPARISON:  None. FINDINGS: There is no evidence of fracture or dislocation. There is no evidence of arthropathy or other focal bone abnormality. No calcaneal spur. Soft tissues are unremarkable. IMPRESSION: Negative.  No evidence of heel spur. Electronically Signed   By: Emmaline Kluver M.D.   On: 04/11/2020 12:47     X-rays negative for bony abnormalities including fracture, or dislocation.  No  soft tissue swelling.    I have reviewed the x-rays myself and the radiologist interpretation. I am in agreement with the radiologist interpretation.     ASSESSMENT & PLAN:  1. Pain of right heel      Meds ordered this encounter  Medications  . predniSONE (DELTASONE) 20 MG tablet    Sig: Take 1 tablet (20 mg total) by mouth 2 (two) times daily with a meal for 5 days.    Dispense:  10 tablet    Refill:  0    Order Specific Question:   Supervising Provider    Answer:   Eustace Moore  [[id number]]   X-rays negative for fracture or dislocation No evidence of heel spur Continue conservative management of rest, ice, elevation and gentle stretches Prednisone prescribed.  Take as directed and to completion Follow up with PCP or orthopedist for recheck Return or go to the ER if you have any new or worsening symptoms (fever, chills, chest pain, redness, swelling, bruising, etc...)    Reviewed expectations re: course of current medical issues. Questions answered. Outlined signs and symptoms indicating need for more acute intervention. Patient verbalized understanding. After Visit Summary given.    Rennis Harding, PA-C 04/11/20 1252

## 2020-04-11 NOTE — Discharge Instructions (Signed)
X-rays negative for fracture or dislocation No evidence of heel spur Continue conservative management of rest, ice, elevation and gentle stretches Prednisone prescribed.  Take as directed and to completion Follow up with PCP or orthopedist for recheck Return or go to the ER if you have any new or worsening symptoms (fever, chills, chest pain, redness, swelling, bruising, etc...)

## 2020-04-11 NOTE — ED Triage Notes (Signed)
Pain in right heel started last Sunday.  States area is red.  No known injury.

## 2022-07-14 IMAGING — DX DG FOOT COMPLETE 3+V*R*
3 series · 3 of 3 positions shown · non-contrast
Comparison: None.

CLINICAL DATA: Rt foot pain since [REDACTED] greatest at the heel.

EXAM:
RIGHT FOOT COMPLETE - 3+ VIEW

[foot ap]
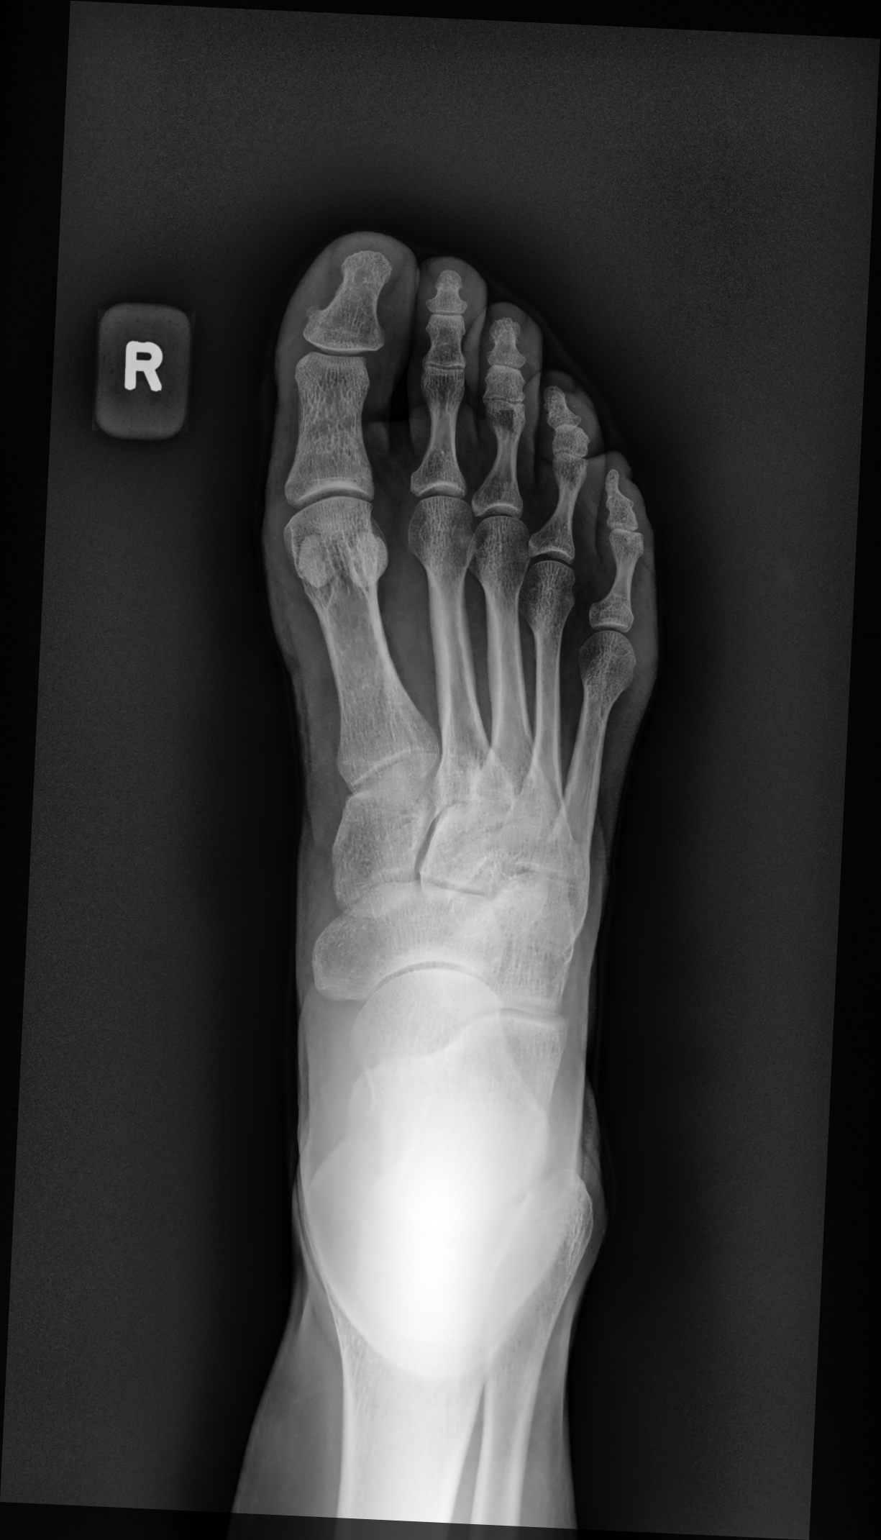

[foot mlo]
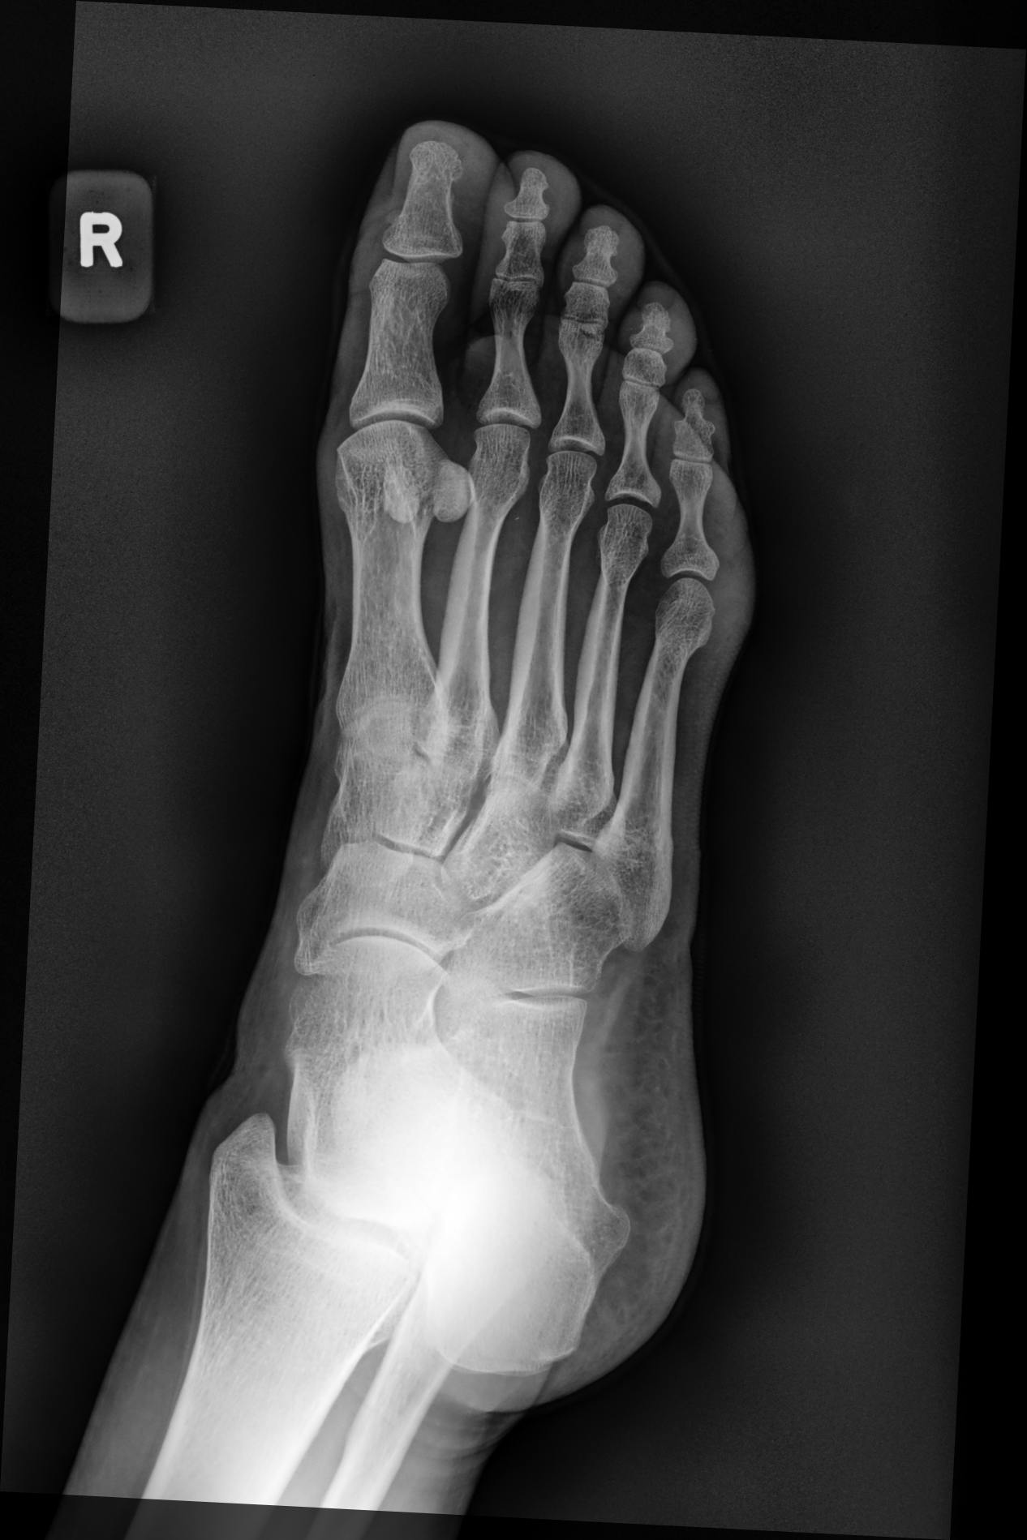

[foot lat]
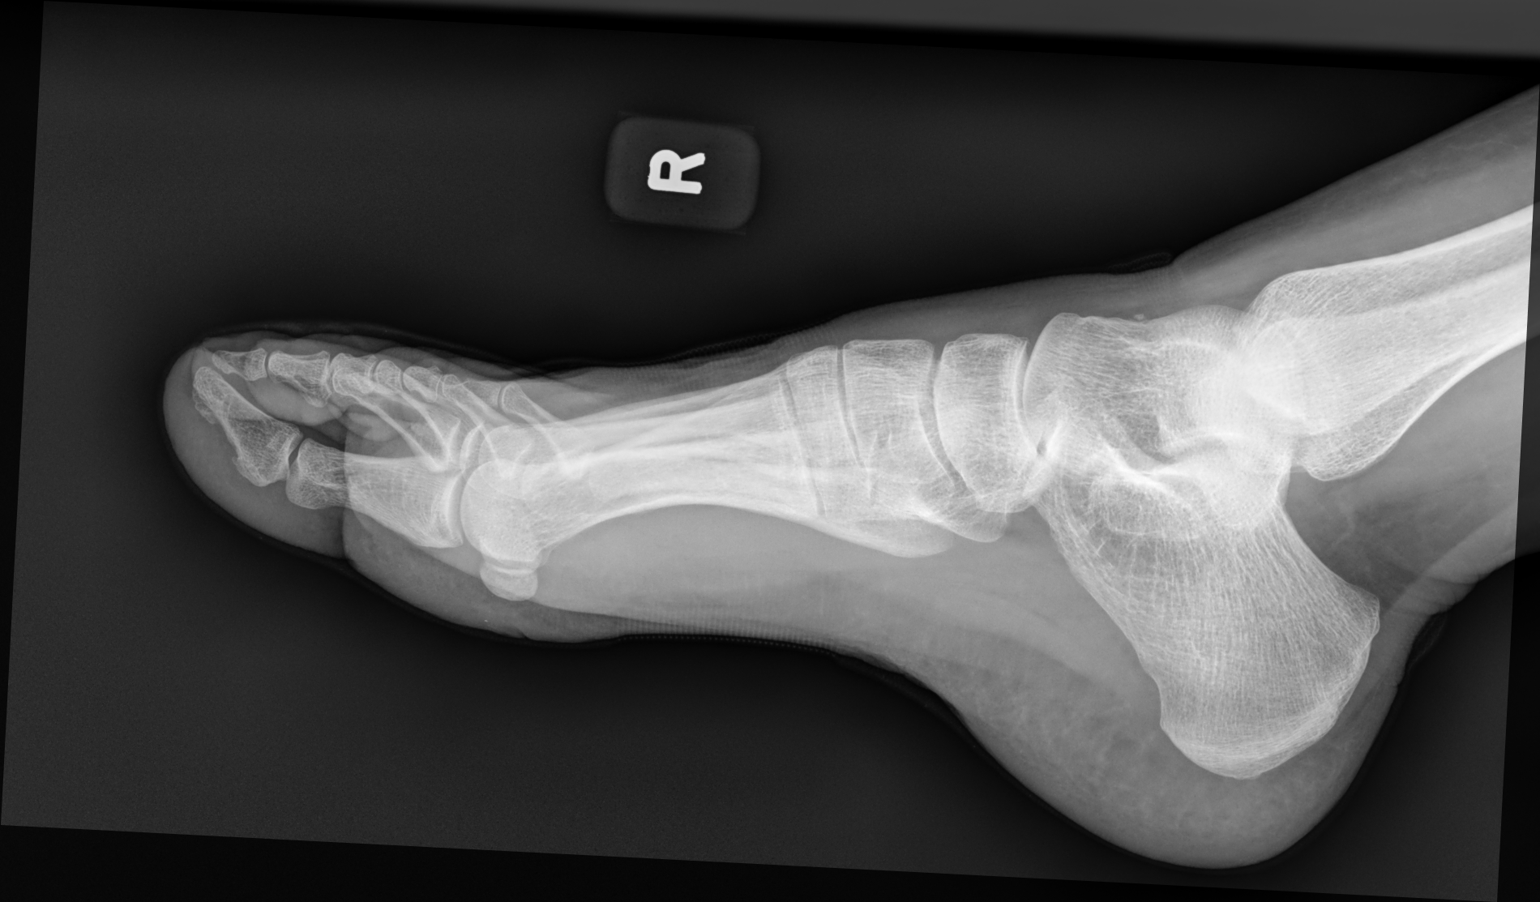

[3 of 3 positions shown; findings below may reference images not displayed]

FINDINGS: There is no evidence of fracture or dislocation. There is no
evidence of arthropathy or other focal bone abnormality. No
calcaneal spur. Soft tissues are unremarkable.
IMPRESSION: Negative.  No evidence of heel spur.
